# Patient Record
Sex: Female | Born: 2000 | Race: White | Hispanic: No | Marital: Single | Smoking: Never smoker
Health system: Southern US, Community
[De-identification: ages and names within clinical notes are randomized; demographics above are authoritative.]

---
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
## 2020-07-24 ENCOUNTER — Encounter (INDEPENDENT_AMBULATORY_CARE_PROVIDER_SITE_OTHER): Payer: Self-pay

## 2021-04-02 ENCOUNTER — Encounter: Payer: Self-pay | Admitting: Physician Assistant

## 2021-04-02 ENCOUNTER — Other Ambulatory Visit: Payer: Self-pay

## 2021-04-02 ENCOUNTER — Ambulatory Visit (INDEPENDENT_AMBULATORY_CARE_PROVIDER_SITE_OTHER): Payer: BC Managed Care – PPO | Admitting: Physician Assistant

## 2021-04-02 VITALS — BP 121/83 | HR 74 | Temp 98.1°F | Ht 65.0 in | Wt 223.0 lb

## 2021-04-02 DIAGNOSIS — Z0000 Encounter for general adult medical examination without abnormal findings: Secondary | ICD-10-CM | POA: Diagnosis not present

## 2021-04-02 DIAGNOSIS — N915 Oligomenorrhea, unspecified: Secondary | ICD-10-CM

## 2021-04-02 DIAGNOSIS — Z111 Encounter for screening for respiratory tuberculosis: Secondary | ICD-10-CM | POA: Diagnosis not present

## 2021-04-02 DIAGNOSIS — F411 Generalized anxiety disorder: Secondary | ICD-10-CM | POA: Diagnosis not present

## 2021-04-02 DIAGNOSIS — Z3041 Encounter for surveillance of contraceptive pills: Secondary | ICD-10-CM | POA: Diagnosis not present

## 2021-04-02 DIAGNOSIS — B001 Herpesviral vesicular dermatitis: Secondary | ICD-10-CM

## 2021-04-02 LAB — POCT URINE PREGNANCY: Preg Test, Ur: NEGATIVE

## 2021-04-02 MED ORDER — ESCITALOPRAM OXALATE 10 MG PO TABS: 10.0000 mg | ORAL_TABLET | Freq: Every day | ORAL | 0 refills | Status: DC

## 2021-04-02 MED ORDER — NORETHINDRONE 0.35 MG PO TABS: 1.0000 | ORAL_TABLET | Freq: Every day | ORAL | 3 refills | Status: DC

## 2021-04-02 MED ORDER — VALACYCLOVIR HCL 1 G PO TABS: ORAL_TABLET | ORAL | 0 refills | Status: AC

## 2021-04-02 NOTE — Patient Instructions (Signed)
Preventive Care 21-21 Years Old, Female °Preventive care refers to lifestyle choices and visits with your health care provider that can promote health and wellness. Preventive care visits are also called wellness exams. °What can I expect for my preventive care visit? °Counseling °During your preventive care visit, your health care provider may ask about your: °Medical history, including: °Past medical problems. °Family medical history. °Pregnancy history. °Current health, including: °Menstrual cycle. °Method of birth control. °Emotional well-being. °Home life and relationship well-being. °Sexual activity and sexual health. °Lifestyle, including: °Alcohol, nicotine or tobacco, and drug use. °Access to firearms. °Diet, exercise, and sleep habits. °Work and work environment. °Sunscreen use. °Safety issues such as seatbelt and bike helmet use. °Physical exam °Your health care provider may check your: °Height and weight. These may be used to calculate your BMI (body mass index). BMI is a measurement that tells if you are at a healthy weight. °Waist circumference. This measures the distance around your waistline. This measurement also tells if you are at a healthy weight and may help predict your risk of certain diseases, such as type 2 diabetes and high blood pressure. °Heart rate and blood pressure. °Body temperature. °Skin for abnormal spots. °What immunizations do I need? °Vaccines are usually given at various ages, according to a schedule. Your health care provider will recommend vaccines for you based on your age, medical history, and lifestyle or other factors, such as travel or where you work. °What tests do I need? °Screening °Your health care provider may recommend screening tests for certain conditions. This may include: °Pelvic exam and Pap test. °Lipid and cholesterol levels. °Diabetes screening. This is done by checking your blood sugar (glucose) after you have not eaten for a while (fasting). °Hepatitis B  test. °Hepatitis C test. °HIV (human immunodeficiency virus) test. °STI (sexually transmitted infection) testing, if you are at risk. °BRCA-related cancer screening. This may be done if you have a family history of breast, ovarian, tubal, or peritoneal cancers. °Talk with your health care provider about your test results, treatment options, and if necessary, the need for more tests. °Follow these instructions at home: °Eating and drinking ° °Eat a healthy diet that includes fresh fruits and vegetables, whole grains, lean protein, and low-fat dairy products. °Take vitamin and mineral supplements as recommended by your health care provider. °Do not drink alcohol if: °Your health care provider tells you not to drink. °You are pregnant, may be pregnant, or are planning to become pregnant. °If you drink alcohol: °Limit how much you have to 0-1 drink a day. °Know how much alcohol is in your drink. In the [place] one drink equals one 12 oz bottle of beer (355 mL), one 5 oz glass of wine (148 mL), or one 1½ oz glass of hard liquor (44 mL). °Lifestyle °Brush your teeth every morning and night with fluoride toothpaste. Floss one time each day. °Exercise for at least 30 minutes 5 or more days each week. °Do not use any products that contain nicotine or tobacco. These products include cigarettes, chewing tobacco, and vaping devices, such as e-cigarettes. If you need help quitting, ask your health care provider. °Do not use drugs. °If you are sexually active, practice safe sex. Use a condom or other form of protection to prevent STIs. °If you do not wish to become pregnant, use a form of birth control. If you plan to become pregnant, see your health care provider for a prepregnancy visit. °Find healthy ways to manage stress, such as: °Meditation, yoga,   or listening to music. °Journaling. °Talking to a trusted person. °Spending time with friends and family. °Minimize exposure to UV radiation to reduce your risk of skin  cancer. °Safety °Always wear your seat belt while driving or riding in a vehicle. °Do not drive: °If you have been drinking alcohol. Do not ride with someone who has been drinking. °If you have been using any mind-altering substances or drugs. °While texting. °When you are tired or distracted. °Wear a helmet and other protective equipment during sports activities. °If you have firearms in your house, make sure you follow all gun safety procedures. °Seek help if you have been physically or sexually abused. °What's next? °Go to your health care provider once a year for an annual wellness visit. °Ask your health care provider how often you should have your eyes and teeth checked. °Stay up to date on all vaccines. °This information is not intended to replace advice given to you by your health care provider. Make sure you discuss any questions you have with your health care provider. °Document Revised: 09/03/2020 Document Reviewed: 09/03/2020 °Elsevier Patient Education © 2022 Elsevier Inc. ° °

## 2021-04-02 NOTE — Progress Notes (Signed)
I,Naomie Crow,acting as a Education administrator for [employer], PA-C.,have documented all relevant documentation on the behalf of Mikey Kirschner, PA-C,as directed by  Mikey Kirschner, PA-C while in the presence of Mikey Kirschner, PA-C.  Complete physical exam   Patient: Shivonne Schwartzman Banker   DOB: November 07, 2000   21 y.o. Female  MRN: [id number] Visit Date: 04/02/2021  Today's healthcare provider: Mikey Kirschner, PA-C   Chief Complaint  Patient presents with   Annual Exam   Subjective    Marque E Gentz is a 21 y.o. female who presents today for a complete physical exam.  She reports consuming a general diet. Home exercise routine includes Patient reports to go walking 1-2 miles daily. She generally feels well. She reports sleeping well. She does have additional problems to discuss today.   HPI  Patient would like to be checked for TB, per school's requirements. She will bring paperwork in at a later date. Patient also reports to be pretty anxious lately and wanting to know if there is anything she can take for it. Patient would like a refill of birth control.  Anxiety --Her father died in the last year, changing schools and what she is studying, difficulties with financial aid. Feels anxious most days, worried and overwhelemed. Denies changes in how she is sleeping, but will be anxious about falling asleep.  Feels she may be at the point where she needs medication.   OCP Stopped ocp as she was out of refills in September she thinks. Her periods not on birth control have always been irregular, every 40-50 days. Very crampy and painful but not a heavy flow. Is interested in restarting birth control. States she is not currently sexually active.    Past Medical History:  Diagnosis Date   Left-sided Bell's palsy 08/19/2017   History reviewed. No pertinent surgical history. Social History   Socioeconomic History   Marital status: Single    Spouse name: Not on file   Number of children: Not on file    Years of education: Not on file   Highest education level: Not on file  Occupational History   Occupation: student    Comment: [employer]  Tobacco Use   Smoking status: Never   Smokeless tobacco: Never  Vaping Use   Vaping Use: Never used  Substance and Sexual Activity   Alcohol use: No    Alcohol/week: 0.0 standard drinks   Drug use: Yes    Types: Marijuana   Sexual activity: Never  Other Topics Concern   Not on file  Social History Narrative   Dawn is a 11th grade student.   She attends [employer].   She lives with both parents. She has one brother.   She enjoys sleeping, drawing, and singing.   Social Determinants of Health   Financial Resource Strain: Not on file  Food Insecurity: Not on file  Transportation Needs: Not on file  Physical Activity: Not on file  Stress: Not on file  Social Connections: Not on file  Intimate Partner Violence: Not on file   Family Status  Relation Name Status   Mother  Alive   Father  Alive   Brother  Alive   MGM  Alive   MGF  Alive   PGM  Alive   PGF  Alive   Family History  Problem Relation Age of Onset   Multiple sclerosis Mother    Hypertension Father    Multiple sclerosis Maternal Grandfather    Diabetes Paternal Grandmother  Hearing loss Paternal Grandfather    Allergies  Allergen Reactions   Doxycycline Nausea Only    Patient Care Team: Mikey Kirschner, PA-C as PCP - General (Physician Assistant)   Medications: Outpatient Medications Prior to Visit  Medication Sig   [DISCONTINUED] norethindrone (MICRONOR) 0.35 MG tablet TAKE 1 TABLET BY MOUTH EVERY DAY (Patient not taking: Reported on 04/02/2021)   [DISCONTINUED] valACYclovir (VALTREX) 1000 MG tablet Take 2 tabs at onset of cold sore, then 1 tab every 12 hrs until resolved. Repeat prn (Patient not taking: Reported on 04/02/2021)   No facility-administered medications prior to visit.    Review of Systems  Constitutional: Negative.  Negative  for fatigue.  Eyes: Negative.   Respiratory:  Negative for cough and shortness of breath.   Cardiovascular: Negative.  Negative for chest pain.  Gastrointestinal: Negative.   Endocrine: Negative.   Genitourinary: Negative.   Musculoskeletal:  Positive for back pain and neck pain.  Skin: Negative.   Allergic/Immunologic: Negative.   Neurological: Negative.   Hematological: Negative.   Psychiatric/Behavioral:  Positive for sleep disturbance. Negative for suicidal ideas. The patient is nervous/anxious.      Objective    Blood pressure 121/83, pulse 74, temperature 98.1 F (36.7 C), temperature source Oral, height '5\' 5"'  (1.651 m), weight 223 lb (101.2 kg), SpO2 100 %.    Physical Exam Constitutional:      General: She is awake.     Appearance: She is well-developed.  HENT:     Head: Normocephalic.     Right Ear: Tympanic membrane normal.     Left Ear: Tympanic membrane normal.  Eyes:     Conjunctiva/sclera: Conjunctivae normal.     Pupils: Pupils are equal, round, and reactive to light.  Neck:     Thyroid: No thyroid mass or thyromegaly.  Cardiovascular:     Rate and Rhythm: Normal rate and regular rhythm.     Heart sounds: Normal heart sounds.  Pulmonary:     Effort: Pulmonary effort is normal.     Breath sounds: Normal breath sounds.  Abdominal:     Palpations: Abdomen is soft.     Tenderness: There is no abdominal tenderness.  Musculoskeletal:     Right lower leg: No swelling.     Left lower leg: No swelling.  Lymphadenopathy:     Cervical: No cervical adenopathy.  Skin:    General: Skin is warm.  Neurological:     Mental Status: She is alert and oriented to person, place, and time.  Psychiatric:        Attention and Perception: Attention normal.        Mood and Affect: Mood normal.        Speech: Speech normal.        Behavior: Behavior is cooperative.      Last depression screening scores PHQ 2/9 Scores 04/02/2021 05/12/2018 05/06/2017  PHQ - 2 Score 1 1 0   PHQ- 9 Score '4 9 4   ' Last fall risk screening Fall Risk  04/02/2021  Falls in the past year? 0  Number falls in past yr: 0  Injury with Fall? 0   Last Audit-C alcohol use screening Alcohol Use Disorder Test (AUDIT) 04/02/2021  1. How often do you have a drink containing alcohol? 0  2. How many drinks containing alcohol do you have on a typical day when you are drinking? 0  3. How often do you have six or more drinks on one occasion? 0  AUDIT-C Score  0   A score of 3 or more in women, and 4 or more in men indicates increased risk for alcohol abuse, EXCEPT if all of the points are from question 1   No results found for any visits on 04/02/21.  Assessment & Plan    Routine Health Maintenance and Physical Exam  Exercise Activities and Dietary recommendations  Goals      Eat more fruits and vegetables     Exercise 150 minutes per week (moderate activity)        Immunization History  Administered Date(s) Administered   DTaP 12/23/2000, 02/24/2001, 04/21/2001, 02/02/2002, 03/08/2005   Hepatitis A 10/19/2011   Hepatitis A, Ped/Adol-2 Dose 08/26/2014   Hepatitis B Aug 23, 2000, 11/22/2000, 04/21/2001   HiB (PRP-OMP) 12/23/2000, 02/24/2001, 04/21/2001, 10/31/2001   IPV 12/23/2000, 02/24/2001, 10/31/2001, 03/08/2005   Influenza-Unspecified 11/20/2017   MMR 10/31/2001, 03/08/2005   Meningococcal B, OMV 05/06/2017, 06/08/2017   Meningococcal Mcv4o 05/06/2017   Meningococcal Polysaccharide 08/26/2014   Moderna Sars-Covid-2 Vaccination 07/01/2019, 08/08/2019, 03/16/2020   Pneumococcal-Unspecified 02/02/2002   Tdap 10/19/2011   Varicella 02/02/2002, 03/08/2005    Health Maintenance  Topic Date Due   HIV Screening  Never done   Hepatitis C Screening  Never done   COVID-19 Vaccine (4 - Booster for Moderna series) 05/11/2020   INFLUENZA VACCINE  06/19/2021 (Originally 10/20/2020)   HPV VACCINES (1 - 2-dose series) 04/02/2022 (Originally 10/19/2011)   TETANUS/TDAP  10/18/2021    Pneumococcal Vaccine 86-72 Years old  Aged Out    Discussed health benefits of physical activity, and encouraged her to engage in regular exercise appropriate for her age and condition.  Valtrex refill pulled, pt has occasional cold sores, none currently. POC hcg neg, due to unknown LMP before starting ocp  Problem List Items Addressed This Visit       Other   GAD (generalized anxiety disorder)    Higher than average baseline anxiety exacerbated by death of father, financial, and school stress. We discussed anxiety in detail, I explained that a therapist or counselor will make the biggest difference in how we can deal with anxiety and things that can trigger it. She is interested, will refer. Also advised she look at [facility] website for in network therapists.   I explained where daily anxiety medication can be useful, and she is interested in trying. Explained common SE and rare SE of SI.  --lexapro      Relevant Medications   escitalopram (LEXAPRO) 10 MG tablet   Other Relevant Orders   Ambulatory referral to Psychology   Other Visit Diagnoses     Encounter for physical examination    -  Primary   Encounter for surveillance of contraceptive pills       Relevant Medications   norethindrone (MICRONOR) 0.35 MG tablet   Screening-pulmonary TB       Relevant Orders   QuantiFERON-TB Gold Plus   Oligomenorrhea, unspecified type       Relevant Orders   POCT urine pregnancy   Cold sore       Relevant Medications   valACYclovir (VALTREX) 1000 MG tablet        Return in about 6 weeks (around 05/14/2021) for anxiety.     I, Mikey Kirschner, PA-C have reviewed all documentation for this visit. The documentation on  04/02/2021 for the exam, diagnosis, procedures, and orders are all accurate and complete.    Mikey Kirschner, PA-C  [facility] [phone] (phone) [phone] (fax)  [facility]

## 2021-04-02 NOTE — Assessment & Plan Note (Signed)
Higher than average baseline anxiety exacerbated by death of father, financial, and school stress. We discussed anxiety in detail, I explained that a therapist or counselor will make the biggest difference in how we can deal with anxiety and things that can trigger it. She is interested, will refer. Also advised she look at [facility] website for in network therapists.   I explained where daily anxiety medication can be useful, and she is interested in trying. Explained common SE and rare SE of SI.  --lexapro

## 2021-04-07 LAB — QUANTIFERON-TB GOLD PLUS
QuantiFERON Mitogen Value: >10 IU/mL
QuantiFERON Nil Value: 0.81 IU/mL
QuantiFERON TB1 Ag Value: 0 IU/mL
QuantiFERON TB2 Ag Value: 0 IU/mL
QuantiFERON-TB Gold Plus: NEGATIVE

## 2021-04-14 ENCOUNTER — Other Ambulatory Visit: Payer: Self-pay | Admitting: Physician Assistant

## 2021-04-14 DIAGNOSIS — B001 Herpesviral vesicular dermatitis: Secondary | ICD-10-CM

## 2021-05-11 ENCOUNTER — Other Ambulatory Visit: Payer: Self-pay

## 2021-05-11 ENCOUNTER — Ambulatory Visit (INDEPENDENT_AMBULATORY_CARE_PROVIDER_SITE_OTHER): Payer: BC Managed Care – PPO | Admitting: Psychologist

## 2021-05-11 DIAGNOSIS — F320 Major depressive disorder, single episode, mild: Secondary | ICD-10-CM | POA: Diagnosis not present

## 2021-05-11 DIAGNOSIS — F411 Generalized anxiety disorder: Secondary | ICD-10-CM

## 2021-05-11 NOTE — Progress Notes (Signed)
                Evin Loiseau, PsyD 

## 2021-05-11 NOTE — Progress Notes (Signed)
[facility] Counselor Initial Adult Exam  Name: Donna Atkins Harn Date: 05/11/2021 MRN: [id number] DOB: 2000/06/11 PCP: Mikey Kirschner, PA-C  Time spent: 9:05 am to 9:29 am; total time: 24 minutes  This session was held via in person. The patient consented to in-person therapy and was in the clinician's office. Limits of confidentiality were discussed with the patient.   Guardian/Payee:  NA    Paperwork requested: No   Reason for Visit /Presenting Problem: Anxiety and depression  Mental Status Exam: Appearance:   Well Groomed     Behavior:  Appropriate  Motor:  Normal  Speech/Language:   Clear and Coherent  Affect:  Appropriate  Mood:  normal  Thought process:  normal  Thought content:    WNL  Sensory/Perceptual disturbances:    WNL  Orientation:  oriented to person, place, and time/date  Attention:  Good  Concentration:  Good  Memory:  WNL  Fund of knowledge:   Good  Insight:    Fair  Judgment:   Good  Impulse Control:  Good     Reported Symptoms:  The patient endorsed experiencing the following: feeling restless, feeling on edge, experiencing multiple worries, difficulty controlling worries, feeling overwhelmed, irritability, and some heart palpations. She denied suicidal and homicidal ideation.  The patient endorsed experiencing the following: feeling down, sad, tearful, rumination of negative thoughts, thoughts of hopelessness, and social isolation. She denied suicidal and homicidal ideation.   Risk Assessment: Danger to Self:  No Self-injurious Behavior: No Danger to Others: No Duty to Warn:no Physical Aggression / Violence:No  Access to Firearms a concern: No  Gang Involvement:No  Patient / guardian was educated about steps to take if suicide or homicide risk level increases between visits: n/a While future psychiatric events cannot be accurately predicted, the patient does not currently require acute inpatient psychiatric care and does not  currently meet [facility] involuntary commitment criteria.  Substance Abuse History: Current substance abuse: No     Past Psychiatric History:   No previous psychological problems have been observed Outpatient Providers:NA History of Psych Hospitalization: No  Psychological Testing:  NA    Abuse History:  Victim of: No.,  NA    Report needed: No. Victim of Neglect:No. Perpetrator of  NA   Witness / Exposure to Domestic Violence: No   Protective Services Involvement: No  Witness to [employer] Violence:  No   Family History:  Family History  Problem Relation Age of Onset   Multiple sclerosis Mother    Hypertension Father    Multiple sclerosis Maternal Grandfather    Diabetes Paternal Grandmother    Hearing loss Paternal Grandfather     Living situation: the patient lives with their family  Sexual Orientation: Straight  Relationship Status: Patient described herself as having a boyfriend who she has been dating for 8 months.   Name of spouse / other: Merrilee Seashore If a parent, number of children / ages:NA  Support Systems: Patient described her mother, boyfriend, and some friends as making up her support system.   Financial Stress:  Yes   Income/Employment/Disability: Patient voiced that currently she is holding down a variety of different jobs while she awaits going back to college.   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 She plans to resume college this summer.   Religion/Sprituality/World View: Patient identified as Engineer, manufacturing.   Any cultural differences that may affect / interfere with treatment:  not applicable  Recreation/Hobbies: Being with family  Stressors: Energy manager difficulties   Marital or family conflict    Strengths: Supportive Relationships, Family, Friends, and Church  Barriers:  NA   Legal History: Pending legal issue / charges:  The patient has no significant history of legal issues. History of legal issue / charges:  NA  Medical History/Surgical History: reviewed Past Medical History:  Diagnosis Date   Left-sided Bell's palsy 08/19/2017    No past surgical history on file.  Medications: Current Outpatient Medications  Medication Sig Dispense Refill   escitalopram (LEXAPRO) 10 MG tablet Take 1 tablet (10 mg total) by mouth daily. 90 tablet 0   norethindrone (MICRONOR) 0.35 MG tablet Take 1 tablet (0.35 mg total) by mouth daily. 84 tablet 3   valACYclovir (VALTREX) 1000 MG tablet Take 2 tabs at onset of cold sore, then 1 tab every 12 hrs until resolved. Repeat prn 30 tablet 0   No current facility-administered medications for this visit.    Allergies  Allergen Reactions   Doxycycline Nausea Only    Diagnoses:  F41.1 generalized anxiety disorder and F32.0 major depressive affective disorder, single episode, mild  Plan of Care: The patient is a 21 year old Caucasian female who was referred to counseling due to experiencing anxiety and depressive symptoms. The patient lives with her mother, older brother, and three dogs. The patient meets criteria for a diagnosis of F41.1 generalized anxiety disorder based off of the following: feeling restless, feeling on edge, experiencing multiple worries, difficulty controlling worries, feeling overwhelmed, irritability, and some heart palpations. She denied suicidal and homicidal ideation. She also meets criteria for a diagnosis of F32.0 major depressive affective disorder, single episode, mild based off of the following: feeling down, sad, tearful, rumination of negative thoughts, thoughts of hopelessness, and social isolation. She denied suicidal and homicidal ideation. The patient may meet criteria for an uncomplicated bereavement disorder, however patient did not indicate that she wanted to process the grief related to the death of her father. This should be continually  monitored and assessed.   The patient stated that she wanted coping skills and to learn to give up control.  This psychologist makes the recommendation that the patient participate in at least monthly therapy to assist her in meeting her goals.    Conception Chancy, PsyD

## 2021-05-11 NOTE — Plan of Care (Signed)

## 2021-05-20 NOTE — Progress Notes (Deleted)
? ?I,Sha'taria Sundance Moise,acting as a Education administrator for [employer], PA-C.,have documented all relevant documentation on the behalf of Mikey Kirschner, PA-C,as directed by  Mikey Kirschner, PA-C while in the presence of Mikey Kirschner, PA-C. ? ?Established Patient Office Visit ? ?Subjective:  ?Patient ID: Donna Atkins, female    DOB: Nov 26, 2000  Age: 21 y.o. MRN: [id number] ? ?CC: No chief complaint on file. ? ? ?HPI ?Donna Atkins presents for 6 to 8 week anxiety follow-up. ?Anxiety, Follow-up ? ?She was last seen for anxiety 7 weeks ago. ?Changes made at last visit include begin escitalopram (LEXAPRO) 10 MG tablet. ?  ?She reports {excellent/good/fair/poor:19665} compliance with treatment. ?She reports {good/fair/poor:18685} tolerance of treatment. ?She {is/is not:21021397} having side effects. {document side effects if present:1} ? ?She feels her anxiety is {Desc; severity:60313} and {improved/worse/unchanged:3041574} since last visit. ? ?Symptoms: ?{Yes/No:20286} chest pain {Yes/No:20286} difficulty concentrating  ?{Yes/No:20286} dizziness {Yes/No:20286} fatigue  ?{Yes/No:20286} feelings of losing control {Yes/No:20286} insomnia  ?{Yes/No:20286} irritable {Yes/No:20286} palpitations  ?{Yes/No:20286} panic attacks {Yes/No:20286} racing thoughts  ?{Yes/No:20286} shortness of breath {Yes/No:20286} sweating  ?{Yes/No:20286} tremors/shakes   ? ?GAD-7 Results ?GAD-7 Generalized Anxiety Disorder Screening Tool 04/02/2021  ?1. Feeling Nervous, Anxious, or on Edge 1  ?2. Not Being Able to Stop or Control Worrying 2  ?3. Worrying Too Much About Different Things 3  ?4. Trouble Relaxing 0  ?5. Being So Restless it's Hard To Sit Still 1  ?6. Becoming Easily Annoyed or Irritable 1  ?7. Feeling Afraid As If Something Awful Might Happen 2  ?Total GAD-7 Score 10  ?Difficulty At Work, Home, or Getting  Along With Others? Somewhat difficult  ? ? ?PHQ-9 Scores ?PHQ9 SCORE ONLY 04/02/2021 05/12/2018 05/06/2017  ?PHQ-9 Total Score _0 ? ? ?--------------------------------------------------------------------------------------------------- ? ?--------------------------------------------------------------------------------------------------- ? ?Past Medical History:  ?Diagnosis Date  ? Left-sided Bell's palsy 08/19/2017  ? ? ?No past surgical history on file. ? ?Family History  ?Problem Relation Age of Onset  ? Multiple sclerosis Mother   ? Hypertension Father   ? Multiple sclerosis Maternal Grandfather   ? Diabetes Paternal Grandmother   ? Hearing loss Paternal Grandfather   ? ? ?Social History  ? ?Socioeconomic History  ? Marital status: Single  ?  Spouse name: Not on file  ? Number of children: Not on file  ? Years of education: Not on file  ? Highest education level: Not on file  ?Occupational History  ? Occupation: student  ?  Comment: [employer]  ?Tobacco Use  ? Smoking status: Never  ? Smokeless tobacco: Never  ?Vaping Use  ? Vaping Use: Never used  ?Substance and Sexual Activity  ? Alcohol use: No  ?  Alcohol/week: 0.0 standard drinks  ? Drug use: Yes  ?  Types: Marijuana  ? Sexual activity: Never  ?Other Topics Concern  ? Not on file  ?Social History Narrative  ? 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  ? She attends [employer].  ? She lives with both parents. She has one brother.  ? She enjoys sleeping, drawing, and singing.  ? ?Social Determinants of Health  ? ?Financial Resource Strain: Not on file  ?Food Insecurity: Not on file  ?Transportation Needs: Not on file  ?Physical Activity: Not on file  ?Stress: Not on file  ?Social Connections: Not on file  ?Intimate Partner Violence: Not on file  ? ? ?Outpatient Medications Prior to Visit  ?Medication Sig Dispense Refill  ? escitalopram (LEXAPRO) 10 MG tablet Take 1 tablet (  10 mg total) by mouth daily. 90 tablet 0  ? norethindrone (MICRONOR) 0.35 MG tablet Take 1 tablet (0.35 mg total) by mouth daily. 84 tablet 3  ? valACYclovir (VALTREX) 1000 MG tablet Take 2 tabs at onset of cold  sore, then 1 tab every 12 hrs until resolved. Repeat prn 30 tablet 0  ? ?No facility-administered medications prior to visit.  ? ? ?Allergies  ?Allergen Reactions  ? Doxycycline Nausea Only  ? ? ?ROS ?Review of Systems ? ?  ?Objective:  ?  ?Physical Exam ? ?There were no vitals taken for this visit. ?Wt Readings from Last 3 Encounters:  ?04/02/21 223 lb (101.2 kg)  ?10/05/18 212 lb (96.2 kg) (98 %, Z= 2.12)*  ?05/12/18 211 lb 12.8 oz (96.1 kg) (98 %, Z= 2.13)*  ? ?* Growth percentiles are based on CDC (Girls, 2-20 Years) data.  ? ? ? ?Health Maintenance Due  ?Topic Date Due  ? HIV Screening  Never done  ? Hepatitis C Screening  Never done  ? COVID-19 Vaccine (4 - Booster for Moderna series) 05/11/2020  ? ? ?There are no preventive care reminders to display for this patient. ? ?No results found for: TSH ?No results found for: WBC, HGB, HCT, MCV, PLT ?No results found for: NA, K, CHLORIDE, CO2, GLUCOSE, BUN, CREATININE, BILITOT, ALKPHOS, AST, ALT, PROT, ALBUMIN, CALCIUM, ANIONGAP, EGFR, GFR ?No results found for: CHOL ?No results found for: HDL ?No results found for: [place] ?No results found for: TRIG ?No results found for: CHOLHDL ?No results found for: HGBA1C ? ?  ?Assessment & Plan:  ? ?Problem List Items Addressed This Visit   ?None ? ? ?No orders of the defined types were placed in this encounter. ? ? ?Follow-up: No follow-ups on file.  ? ? ?Beverlee Nims, [place] ?

## 2021-05-21 ENCOUNTER — Ambulatory Visit: Payer: Self-pay | Admitting: Physician Assistant

## 2021-06-01 ENCOUNTER — Ambulatory Visit: Payer: BC Managed Care – PPO | Admitting: Psychologist

## 2021-06-29 ENCOUNTER — Other Ambulatory Visit: Payer: Self-pay | Admitting: Physician Assistant

## 2021-06-29 DIAGNOSIS — F411 Generalized anxiety disorder: Secondary | ICD-10-CM

## 2021-07-25 ENCOUNTER — Other Ambulatory Visit: Payer: Self-pay | Admitting: Physician Assistant

## 2021-07-25 DIAGNOSIS — F411 Generalized anxiety disorder: Secondary | ICD-10-CM

## 2021-09-02 ENCOUNTER — Ambulatory Visit (INDEPENDENT_AMBULATORY_CARE_PROVIDER_SITE_OTHER): Payer: BC Managed Care – PPO | Admitting: Physician Assistant

## 2021-09-02 ENCOUNTER — Encounter: Payer: Self-pay | Admitting: Physician Assistant

## 2021-09-02 VITALS — BP 131/85 | HR 72 | Temp 98.0°F | Ht 65.0 in | Wt 231.5 lb

## 2021-09-02 DIAGNOSIS — H60501 Unspecified acute noninfective otitis externa, right ear: Secondary | ICD-10-CM

## 2021-09-02 MED ORDER — CIPROFLOXACIN-DEXAMETHASONE 0.3-0.1 % OT SUSP: 4.0000 [drp] | Freq: Two times a day (BID) | OTIC | 0 refills | Status: AC

## 2021-09-02 NOTE — Progress Notes (Signed)
    I,Sha'taria Tyson,acting as a Neurosurgeon for [employer], PA-C.,have documented all relevant documentation on the behalf of Alfredia Ferguson, PA-C,as directed by  Alfredia Ferguson, PA-C while in the presence of Alfredia Ferguson, PA-C.   Acute Office Visit  Subjective:     Patient ID: Donna Atkins, female    DOB: Mar 15, 2001, 21 y.o.   MRN: [id number]  Cc. Right ear pain x 1 week   HPI Donna Atkins is a 21 y/o female who presents today with right ear pain x 1 week. She was swimming last week and feels she got water stuck in her ear. Since then it has been painful, feels pain down to jaw, thinks it is swollen. Today she started with a runny nose and sore throat. Denies fevers, chills ,headaches, cough, sob, change in hearing.     Objective:    Blood pressure 131/85, pulse 72, temperature 98 F (36.7 C), temperature source Oral, height 5\' 5"  (1.651 m), weight 231 lb 8 oz (105 kg), SpO2 100 %.   Physical Exam Constitutional:      General: She is awake.     Appearance: She is well-developed.  HENT:     Head: Normocephalic.     Right Ear: There is impacted cerumen.     Left Ear: There is impacted cerumen.     Ears:     Comments: Impacted cerumen in right ear unable to visualize tm Eyes:     Conjunctiva/sclera: Conjunctivae normal.  Cardiovascular:     Rate and Rhythm: Normal rate and regular rhythm.     Heart sounds: Normal heart sounds.  Pulmonary:     Effort: Pulmonary effort is normal.     Breath sounds: Normal breath sounds.  Skin:    General: Skin is warm.  Neurological:     Mental Status: She is alert and oriented to person, place, and time.  Psychiatric:        Attention and Perception: Attention normal.        Mood and Affect: Mood normal.        Speech: Speech normal.        Behavior: Behavior is cooperative.     Assessment & Plan:   Otitis externa Unable to visualize TM -- will treat as otitis externa, ciprodex 4 drops bid x 7 days Advised pt if symptoms continue  will treat as otitis media; advised to leave ear alone, until symptoms resolved and then can use earwax softening drops  Return if symptoms worsen or fail to improve.   I, , PA-C have reviewed all documentation for this visit. The documentation on  09/02/2021  for the exam, diagnosis, procedures, and orders are all accurate and complete.  09/04/2021, PA-C [facility] [address] [place], [place], [place] Office: [phone] Fax: [phone]

## 2022-02-08 ENCOUNTER — Other Ambulatory Visit: Payer: Self-pay | Admitting: Physician Assistant

## 2022-02-08 DIAGNOSIS — F411 Generalized anxiety disorder: Secondary | ICD-10-CM

## 2022-03-13 ENCOUNTER — Other Ambulatory Visit: Payer: Self-pay | Admitting: Physician Assistant

## 2022-03-13 DIAGNOSIS — F411 Generalized anxiety disorder: Secondary | ICD-10-CM

## 2022-07-09 ENCOUNTER — Other Ambulatory Visit: Payer: Self-pay | Admitting: Physician Assistant

## 2022-07-09 DIAGNOSIS — Z3041 Encounter for surveillance of contraceptive pills: Secondary | ICD-10-CM

## 2022-09-16 ENCOUNTER — Ambulatory Visit: Payer: BC Managed Care – PPO | Admitting: Physician Assistant

## 2022-09-16 NOTE — Progress Notes (Deleted)
  Established patient visit  Patient: Donna Atkins   DOB: 04-18-2000   22 y.o. Female  MRN: [id number] Visit Date: 09/16/2022  Today's healthcare provider: Debera Lat, PA-C   No chief complaint on file.  Subjective    HPI  *** Discussed the use of AI scribe software for clinical note transcription with the patient, who gave verbal consent to proceed.  History of Present Illness               04/02/2021    1:47 PM 05/12/2018    4:32 PM 05/06/2017    8:56 AM  Depression screen PHQ 2/9  Decreased Interest 0 1 0  Down, Depressed, Hopeless 1 0 0  PHQ - 2 Score 1 1 0  Altered sleeping 0 3 3  Tired, decreased energy 1 2 0  Change in appetite 1 3 1   Feeling bad or failure about yourself  0  0  Trouble concentrating 0 0 0  Moving slowly or fidgety/restless 1 0 0  Suicidal thoughts 0 0 0  PHQ-9 Score 4 9 4   Difficult doing work/chores Very difficult Somewhat difficult Not difficult at all      04/02/2021    1:48 PM  GAD 7 : Generalized Anxiety Score  Nervous, Anxious, on Edge 1  Control/stop worrying 2  Worry too much - different things 3  Trouble relaxing 0  Restless 1  Easily annoyed or irritable 1  Afraid - awful might happen 2  Total GAD 7 Score 10  Anxiety Difficulty Somewhat difficult    Medications: Outpatient Medications Prior to Visit  Medication Sig   escitalopram (LEXAPRO) 10 MG tablet TAKE 1 TABLET BY MOUTH EVERY DAY   norethindrone (MICRONOR) 0.35 MG tablet TAKE 1 TABLET BY MOUTH EVERY DAY   valACYclovir (VALTREX) 1000 MG tablet Take 2 tabs at onset of cold sore, then 1 tab every 12 hrs until resolved. Repeat prn   No facility-administered medications prior to visit.    Review of Systems Except see HPI   {Labs  Heme  Chem  Endocrine  Serology  Results Review (optional):23779}   Objective    There were no vitals taken for this visit. {Show previous vital signs (optional):23777}  Physical Exam   No results found for any visits on  09/16/22.  Assessment & Plan    *** Assessment and Plan              No follow-ups on file.     The patient was advised to call back or seek an in-person evaluation if the symptoms worsen or if the condition fails to improve as anticipated.  I discussed the assessment and treatment plan with the patient. The patient was provided an opportunity to ask questions and all were answered. The patient agreed with the plan and demonstrated an understanding of the instructions.  I, Debera Lat, PA-C have reviewed all documentation for this visit. The documentation on  6/04/28/22 for the exam, diagnosis, procedures, and orders are all accurate and complete.  Debera Lat, [facility], MMS [facility] [phone] (phone) [phone] (fax)  [facility]

## 2022-09-20 ENCOUNTER — Encounter: Payer: Self-pay | Admitting: Physician Assistant

## 2022-09-20 ENCOUNTER — Ambulatory Visit (INDEPENDENT_AMBULATORY_CARE_PROVIDER_SITE_OTHER): Payer: BC Managed Care – PPO | Admitting: Physician Assistant

## 2022-09-20 VITALS — BP 119/78 | HR 81 | Ht 65.0 in | Wt 228.0 lb

## 2022-09-20 DIAGNOSIS — J3081 Allergic rhinitis due to animal (cat) (dog) hair and dander: Secondary | ICD-10-CM | POA: Diagnosis not present

## 2022-09-20 DIAGNOSIS — M545 Low back pain, unspecified: Secondary | ICD-10-CM | POA: Diagnosis not present

## 2022-09-20 LAB — POCT URINALYSIS DIPSTICK
Bilirubin, UA: NEGATIVE
Glucose, UA: NEGATIVE
Ketones, UA: NEGATIVE
Leukocytes, UA: NEGATIVE
Nitrite, UA: NEGATIVE
Protein, UA: NEGATIVE
Spec Grav, UA: >=1.03 — AB (ref 1.010–1.025)
Urobilinogen, UA: 0.2 E.U./dL
pH, UA: 6 (ref 5.0–8.0)

## 2022-09-20 MED ORDER — BACLOFEN 10 MG PO TABS: 5.0000 mg | ORAL_TABLET | Freq: Three times a day (TID) | ORAL | 0 refills | Status: AC

## 2022-09-20 NOTE — Progress Notes (Signed)
Established patient visit   Patient: Donna Atkins   DOB: Aug 06, 2000   21 y.o. Female  MRN: [id number] Visit Date: 09/20/2022  Today's healthcare provider: Alfredia Ferguson, PA-C   Chief Complaint  Patient presents with   Back Pain   Subjective    HPI Discussed the use of AI scribe software for clinical note transcription with the patient, who gave verbal consent to proceed.  History of Present Illness   The patient presents with severe low back pain that started after a normal work shift. The pain, initially manageable with a heating pad, has since worsened significantly. The pain is located in the lumbar region, surrounding the spine, and has recently extended to the neck. They deny any recent injury or heavy lifting and the pain does not radiate to the legs. Over-the-counter ibuprofen or Tylenol every four hours provides some relief. The patient notes that previously, twisting would crack their back and provide relief, but this is no longer the case. There are no changes in bowel movements and urination is normal.    They also report a cat allergy, with symptoms including swollen eyes. Wondering if there was anything to take to help these allergy symptoms.       Medications: Outpatient Medications Prior to Visit  Medication Sig   escitalopram (LEXAPRO) 10 MG tablet TAKE 1 TABLET BY MOUTH EVERY DAY   norethindrone (MICRONOR) 0.35 MG tablet TAKE 1 TABLET BY MOUTH EVERY DAY   valACYclovir (VALTREX) 1000 MG tablet Take 2 tabs at onset of cold sore, then 1 tab every 12 hrs until resolved. Repeat prn   No facility-administered medications prior to visit.    Review of Systems  Constitutional:  Negative for fatigue and fever.  Respiratory:  Negative for cough and shortness of breath.   Cardiovascular:  Negative for chest pain and leg swelling.  Gastrointestinal:  Negative for abdominal pain.  Musculoskeletal:  Positive for back pain and neck pain.  Neurological:  Negative  for dizziness and headaches.      Objective    BP 119/78 (BP Location: Left Arm, Patient Position: Sitting, Cuff Size: Normal)   Pulse 81   Ht 5\' 5"  (1.651 m)   Wt 228 lb (103.4 kg)   LMP 09/20/2022   SpO2 100%   BMI 37.94 kg/m   Physical Exam Vitals reviewed.  Constitutional:      Appearance: She is not ill-appearing.  HENT:     Head: Normocephalic.  Eyes:     Conjunctiva/sclera: Conjunctivae normal.  Cardiovascular:     Rate and Rhythm: Normal rate.  Pulmonary:     Effort: Pulmonary effort is normal. No respiratory distress.  Musculoskeletal:     Comments: No rashes, edema. Nontender to palpation of low back or beck  Neurological:     General: No focal deficit present.     Mental Status: She is alert and oriented to person, place, and time.  Psychiatric:        Mood and Affect: Mood normal.        Behavior: Behavior normal.      Results for orders placed or performed in visit on 09/20/22  POCT Urinalysis Dipstick  Result Value Ref Range   Color, UA     Clarity, UA     Glucose, UA Negative Negative   Bilirubin, UA negative    Ketones, UA negative    Spec Grav, UA >=1.030 (A) 1.010 - 1.025   Blood, UA small  pH, UA 6.0 5.0 - 8.0   Protein, UA Negative Negative   Urobilinogen, UA 0.2 0.2 or 1.0 E.U./dL   Nitrite, UA negative    Leukocytes, UA Negative Negative   Appearance     Odor      Assessment & Plan     Assessment and Plan    Back Pain:  UA negative. + blood but pt is on her menstrual cycle -Continue over-the-counter ibuprofen or Tylenol as needed. -Prescribe Baclofen for muscle relaxation. Cautioned about potential drowsiness. -Advise heat or ice application, stretching, and yoga. -If no improvement in 5-7 days, patient to notify the office.  Allergies: Recent exposure to new cat at home leading to allergic symptoms including swollen eyes. -Advise over-the-counter antihistamines such as Zyrtec, Claritin, or Allegra as needed for allergic  symptoms.       Return if symptoms worsen or fail to improve.      I, Alfredia Ferguson, PA-C have reviewed all documentation for this visit. The documentation on  09/20/22   for the exam, diagnosis, procedures, and orders are all accurate and complete.  Alfredia Ferguson, PA-C [facility] [address] [place], [place], [address] Office: [phone] Fax: [phone]   [facility]

## 2022-10-17 ENCOUNTER — Other Ambulatory Visit: Payer: Self-pay | Admitting: Family Medicine

## 2022-10-17 DIAGNOSIS — F411 Generalized anxiety disorder: Secondary | ICD-10-CM

## 2022-10-19 NOTE — Telephone Encounter (Signed)
Called pt regarding whether they will be staying at [facility]. No answer.

## 2022-10-19 NOTE — Telephone Encounter (Signed)
Requested medications are due for refill today.  yes  Requested medications are on the active medications list.  yes  Last refill. 03/16/2022 #90 0 rf  Future visit scheduled.   no  Notes to clinic.  Donna Atkins pt - called pt no answer.    Requested Prescriptions  Pending Prescriptions Disp Refills   escitalopram (LEXAPRO) 10 MG tablet [Pharmacy Med Name: ESCITALOPRAM 10 MG TABLET] 30 tablet 2    Sig: TAKE 1 TABLET BY MOUTH EVERY DAY     Psychiatry:  Antidepressants - SSRI Passed - 10/17/2022  1:13 AM      Passed - Valid encounter within last 6 months    Recent Outpatient Visits           4 weeks ago Low back pain, unspecified back pain laterality, unspecified chronicity, unspecified whether sciatica present   [facility] Donna Ferguson, PA-C   1 year ago Acute otitis externa of right ear, unspecified type   [facility] Donna Ferguson, PA-C   1 year ago Encounter for physical examination   [facility] Donna Ferguson, PA-C   4 years ago Encounter for surveillance of contraceptive pills   [facility] [place], [place], [place]   4 years ago Encounter for routine child health examination without abnormal findings   [facility] [place], [place], [place]

## 2022-11-02 ENCOUNTER — Other Ambulatory Visit: Payer: Self-pay | Admitting: Family Medicine

## 2022-11-02 DIAGNOSIS — F411 Generalized anxiety disorder: Secondary | ICD-10-CM

## 2022-11-09 ENCOUNTER — Encounter: Payer: Self-pay | Admitting: Family Medicine

## 2022-11-09 ENCOUNTER — Ambulatory Visit: Payer: BC Managed Care – PPO | Admitting: Family Medicine

## 2022-11-15 ENCOUNTER — Ambulatory Visit (INDEPENDENT_AMBULATORY_CARE_PROVIDER_SITE_OTHER): Payer: BC Managed Care – PPO | Admitting: Family Medicine

## 2022-11-15 ENCOUNTER — Encounter: Payer: Self-pay | Admitting: Family Medicine

## 2022-11-15 VITALS — BP 108/81 | HR 76 | Ht 65.0 in | Wt 222.1 lb

## 2022-11-15 DIAGNOSIS — M799 Soft tissue disorder, unspecified: Secondary | ICD-10-CM

## 2022-11-15 NOTE — Patient Instructions (Addendum)
We will do an ultrasound to see if there is a fluid collection (potential infection) deeper beneath the skin.  Please keep an eye on the area and let me know if you start to experience more persistent pain and/or develop redness to the area, at which point I will go ahead and send in an antibiotic.   I do recommend you start the HPV vaccine series to help prevent development of cervical cancer.

## 2022-11-15 NOTE — Progress Notes (Unsigned)
Established patient visit   Patient: Donna Atkins   DOB: April 06, 2000   22 y.o. Female  MRN: [id number] Visit Date: 11/15/2022  Today's healthcare provider: Sherlyn Hay, DO   Chief Complaint  Patient presents with   Medical Management of Chronic Issues    Cyst on top of buttocks, more towards the middle of buttocks. Has been present for about 1 week. Discomfort on the left side of buttocks, has been lanced 2x in the past    Subjective    HPI Cyst/abscess: Lanced twice in the past  - 2016 - ER in [place] while on roadtrip - lanced and packed.  - 2019/2020 (roughly) - Tried antibiotic the second time as conservative measure but had to return to have I&d anyway. Current - noticed one week ago  - At gym, after doing sit-ups or something on her tailbone, it becomes sore and tingles and stays that way for several hours.  - Has been going to the gym for the past 2-3 months.   - Stopped doing the floor workouts when this started.  - No pain with palpation  - This is the smallest she's ever felt it.  - Doesn't notice while sitting or laying down  - Does occasionally notice when she's driving.   HPV vaccines - doesn't think she's had them.    Medications: Outpatient Medications Prior to Visit  Medication Sig   escitalopram (LEXAPRO) 10 MG tablet TAKE 1 TABLET BY MOUTH EVERY DAY   famotidine (PEPCID) 20 MG tablet Take 20 mg by mouth 2 (two) times daily.   norethindrone (MICRONOR) 0.35 MG tablet TAKE 1 TABLET BY MOUTH EVERY DAY   valACYclovir (VALTREX) 1000 MG tablet Take 2 tabs at onset of cold sore, then 1 tab every 12 hrs until resolved. Repeat prn   baclofen (LIORESAL) 10 MG tablet Take 0.5-1 tablets (5-10 mg total) by mouth 3 (three) times daily. (Patient not taking: Reported on 11/15/2022)   No facility-administered medications prior to visit.    Review of Systems  Constitutional:  Negative for appetite change, chills, fatigue and fever.  Respiratory:  Negative for  chest tightness and shortness of breath.   Cardiovascular:  Negative for chest pain and palpitations.  Gastrointestinal:  Negative for abdominal pain, diarrhea, nausea and vomiting.  Genitourinary:  Negative for dysuria.  Neurological:  Negative for dizziness and weakness.        Objective    BP 108/81 (BP Location: Left Arm, Patient Position: Sitting, Cuff Size: Large)   Pulse 76   Ht 5\' 5"  (1.651 m)   Wt 222 lb 1.6 oz (100.7 kg)   SpO2 100%   BMI 36.96 kg/m     Physical Exam Vitals and nursing note reviewed.  Constitutional:      General: She is not in acute distress.    Appearance: Normal appearance.  HENT:     Head: Normocephalic and atraumatic.  Eyes:     General: No scleral icterus.    Conjunctiva/sclera: Conjunctivae normal.  Cardiovascular:     Rate and Rhythm: Normal rate.  Pulmonary:     Effort: Pulmonary effort is normal.  Skin:      Neurological:     Mental Status: She is alert and oriented to person, place, and time. Mental status is at baseline.  Psychiatric:        Mood and Affect: Mood normal.        Behavior: Behavior normal.      No  results found for any visits on 11/15/22.  Assessment & Plan    Soft tissue complaint Assessment & Plan: Low suspicion for abscess at this point.  However, given patient's history of recurrent abscesses to the area, will order an ultrasound for further evaluation.   If ultrasound shows evidence of abscess, will send antibiotic and consider referral for surgical evaluation at that point, given potential for a pilonidal cyst with presence of a tract  Orders: -     US PELVIS LIMITED (TRANSABDOMINAL ONLY); Future   Return if symptoms worsen or fail to improve.      I discussed the assessment and treatment plan with the patient  The patient was provided an opportunity to ask questions and all were answered. The patient agreed with the plan and demonstrated an understanding of the instructions.   The patient was  advised to call back or seek an in-person evaluation if the symptoms worsen or if the condition fails to improve as anticipated.    Sherlyn Hay, DO  [facility] [phone] (phone) [phone] (fax)  [facility]

## 2022-11-15 NOTE — Assessment & Plan Note (Signed)
Low suspicion for abscess at this point.  However, given patient's history of recurrent abscesses to the area, will order an ultrasound for further evaluation.   If ultrasound shows evidence of abscess, will send antibiotic and consider referral for surgical evaluation at that point, given potential for a pilonidal cyst with presence of a tract.

## 2022-11-16 ENCOUNTER — Encounter: Payer: Self-pay | Admitting: Family Medicine

## 2022-12-10 ENCOUNTER — Encounter: Payer: Self-pay | Admitting: Family Medicine

## 2022-12-10 ENCOUNTER — Ambulatory Visit (INDEPENDENT_AMBULATORY_CARE_PROVIDER_SITE_OTHER): Payer: BC Managed Care – PPO | Admitting: Family Medicine

## 2022-12-10 ENCOUNTER — Ambulatory Visit
Admission: RE | Admit: 2022-12-10 | Discharge: 2022-12-10 | Disposition: A | Discharge status: Home / Self Care | Payer: BC Managed Care – PPO | Source: Ambulatory Visit | Attending: Family Medicine | Admitting: Family Medicine

## 2022-12-10 VITALS — BP 115/86 | HR 86 | Ht 65.0 in | Wt 221.5 lb

## 2022-12-10 DIAGNOSIS — M799 Soft tissue disorder, unspecified: Secondary | ICD-10-CM | POA: Diagnosis present

## 2022-12-10 DIAGNOSIS — R3989 Other symptoms and signs involving the genitourinary system: Secondary | ICD-10-CM | POA: Insufficient documentation

## 2022-12-10 MED ORDER — NITROFURANTOIN MONOHYD MACRO 100 MG PO CAPS: 100.0000 mg | ORAL_CAPSULE | Freq: Two times a day (BID) | ORAL | 0 refills | Status: AC

## 2022-12-10 NOTE — Assessment & Plan Note (Signed)
Acute, moder-severe x 24 hours Recommend tx given UA Will send for Ucx and advise supportive treatments

## 2022-12-10 NOTE — Progress Notes (Signed)
Established patient visit   Patient: Donna Atkins   DOB: 2000/05/30   22 y.o. Female  MRN: [id number] Visit Date: 12/10/2022  Today's healthcare provider: Jacky Kindle, FNP  Introduced to nurse practitioner role and practice setting.  All questions answered.  Discussed provider/patient relationship and expectations.  Subjective    Urinary Tract Infection    HPI     Urinary Tract Infection   This is a new problem.  Recent episode started yesterday.  Severity of the pain is moderate.  The problem occurs every urination.  The patient  has not been recently treated for similar symptoms.  Abdominal Pain: Present.  Back Pain: Present.  Chills: Absent.  Cloudy malodorus urine: Absent.  Constipation: Absent.  Cramping: Present.  Diarrhea: Absent.  Discharge: Absent.  Fever: Absent.  Hematuria: Absent.  Nausea: Present.  Vomiting: Absent. Additional comments: Burning sensation during and after urinating Patient reports she started taking azo yesterday      Last edited by Acey Lav, CMA on 12/10/2022  9:52 AM.      Medications: Outpatient Medications Prior to Visit  Medication Sig   baclofen (LIORESAL) 10 MG tablet Take 0.5-1 tablets (5-10 mg total) by mouth 3 (three) times daily.   escitalopram (LEXAPRO) 10 MG tablet TAKE 1 TABLET BY MOUTH EVERY DAY   norethindrone (MICRONOR) 0.35 MG tablet TAKE 1 TABLET BY MOUTH EVERY DAY   valACYclovir (VALTREX) 1000 MG tablet Take 2 tabs at onset of cold sore, then 1 tab every 12 hrs until resolved. Repeat prn   famotidine (PEPCID) 20 MG tablet Take 20 mg by mouth 2 (two) times daily. (Patient not taking: Reported on 12/10/2022)   No facility-administered medications prior to visit.     Objective    BP 115/86 (BP Location: Left Arm, Patient Position: Sitting, Cuff Size: Normal)   Pulse 86   Ht 5\' 5"  (1.651 m)   Wt 221 lb 8 oz (100.5 kg)   SpO2 100%   BMI 36.86 kg/m   Physical Exam Vitals and nursing note reviewed.   Constitutional:      General: She is not in acute distress.    Appearance: Normal appearance. She is obese. She is not ill-appearing, toxic-appearing or diaphoretic.  HENT:     Head: Normocephalic and atraumatic.  Cardiovascular:     Rate and Rhythm: Normal rate and regular rhythm.     Pulses: Normal pulses.     Heart sounds: Normal heart sounds. No murmur heard.    No friction rub. No gallop.  Pulmonary:     Effort: Pulmonary effort is normal. No respiratory distress.     Breath sounds: Normal breath sounds. No stridor. No wheezing, rhonchi or rales.  Chest:     Chest wall: No tenderness.  Abdominal:     Tenderness: There is no right CVA tenderness or left CVA tenderness.  Musculoskeletal:        General: No swelling, tenderness, deformity or signs of injury. Normal range of motion.     Right lower leg: No edema.     Left lower leg: No edema.  Skin:    General: Skin is warm and dry.     Capillary Refill: Capillary refill takes less than 2 seconds.     Coloration: Skin is not jaundiced or pale.     Findings: No bruising, erythema, lesion or rash.  Neurological:     General: No focal deficit present.     Mental Status: She is alert and  oriented to person, place, and time. Mental status is at baseline.     Cranial Nerves: No cranial nerve deficit.     Sensory: No sensory deficit.     Motor: No weakness.     Coordination: Coordination normal.  Psychiatric:        Mood and Affect: Mood normal.        Behavior: Behavior normal.        Thought Content: Thought content normal.        Judgment: Judgment normal.     No results found for any visits on 12/10/22.  Assessment & Plan     Problem List Items Addressed This Visit       Other   Suspected UTI - Primary    Acute, moder-severe x 24 hours Recommend tx given UA Will send for Ucx and advise supportive treatments      Relevant Orders   Urine Culture   No follow-ups on file.     Leilani Merl, FNP, have reviewed  all documentation for this visit. The documentation on 12/10/22 for the exam, diagnosis, procedures, and orders are all accurate and complete.  Jacky Kindle, FNP  [facility] [phone] (phone) [phone] (fax)  [facility]

## 2022-12-14 LAB — URINE CULTURE

## 2022-12-14 NOTE — Progress Notes (Signed)
Macrobid sent; please reach out if symptoms remain.

## 2022-12-29 ENCOUNTER — Telehealth: Payer: Self-pay | Admitting: Family Medicine

## 2023-01-06 NOTE — Telephone Encounter (Signed)
Patient returning PCP call

## 2023-01-07 NOTE — Telephone Encounter (Signed)
Checked in with patient regarding the tenderness she was having over her sacrum.  She reports it has completely resolved at this point, so I suspect the area of cellulitis has resolved and I will not be prescribing antibiotics at this time.  Advised patient to follow-up if she starts to have more problems with it.  Discussed that, based on her ultrasound 12/10/2022, it does not appear to be a pilonidal cyst, so does not necessarily require referral to surgery but that we can consider it in the future if these flareups continue to occur in the same location.

## 2023-11-25 ENCOUNTER — Other Ambulatory Visit: Payer: Self-pay | Admitting: Family Medicine

## 2023-11-25 DIAGNOSIS — Z3041 Encounter for surveillance of contraceptive pills: Secondary | ICD-10-CM

## 2023-11-25 MED ORDER — NORETHINDRONE 0.35 MG PO TABS: 1.0000 | ORAL_TABLET | Freq: Every day | ORAL | 2 refills | Status: AC

## 2023-11-25 NOTE — Telephone Encounter (Signed)
 Copied from CRM [id number]. Topic: Clinical - Medication Refill >> Nov 25, 2023  3:41 PM Santiya F wrote: Medication: norethindrone  (MICRONOR ) 0.35 MG tablet [[id number]]  Has the patient contacted their pharmacy? Yes  (Agent: If yes, when and what did the pharmacy advise?) [facility] office   This is the patient's preferred pharmacy:  [facility] [address], [place] - [address] [place] [address] Phone: [phone] Fax: [phone]  Is this the correct pharmacy for this prescription? Yes If no, delete pharmacy and type the correct one.   Has the prescription been filled recently? Yes  Is the patient out of the medication? Yes  Has the patient been seen for an appointment in the last year OR does the patient have an upcoming appointment? Yes  Can we respond through MyChart? Yes  Agent: Please be advised that Rx refills may take up to 3 business days. We ask that you follow-up with your pharmacy.

## 2023-11-25 NOTE — Telephone Encounter (Signed)
 Requested Prescriptions  Pending Prescriptions Disp Refills   norethindrone  (MICRONOR ) 0.35 MG tablet 28 tablet 2    Sig: Take 1 tablet (0.35 mg total) by mouth daily.     OB/GYN: Contraceptives - Progestins Failed - 11/25/2023  5:54 PM      Failed - Valid encounter within last 12 months    Recent Outpatient Visits   None            Passed - Last BP in normal range    BP Readings from Last 1 Encounters:  12/10/22 115/86         Passed - Patient is not a smoker

## 2023-12-01 ENCOUNTER — Ambulatory Visit: Admitting: Family Medicine
